# Patient Record
Sex: Male | Born: 1975 | Race: Black or African American | Hispanic: No | Marital: Married | Smoking: Never smoker
Health system: Southern US, Community
[De-identification: ages and names within clinical notes are randomized; demographics above are authoritative.]

## PROBLEM LIST (undated history)

## (undated) DIAGNOSIS — E119 Type 2 diabetes mellitus without complications: Secondary | ICD-10-CM

## (undated) HISTORY — PX: ACHILLES TENDON REPAIR: SUR1153

## (undated) HISTORY — PX: ELBOW SURGERY: SHX618

---

## 2005-07-01 ENCOUNTER — Emergency Department (HOSPITAL_COMMUNITY): Admission: EM | Admit: 2005-07-01 | Discharge: 2005-07-01 | Payer: Self-pay | Admitting: Emergency Medicine

## 2008-10-25 ENCOUNTER — Emergency Department (HOSPITAL_COMMUNITY): Admission: EM | Admit: 2008-10-25 | Discharge: 2008-10-25 | Payer: Self-pay | Admitting: Emergency Medicine

## 2008-11-17 ENCOUNTER — Ambulatory Visit (HOSPITAL_COMMUNITY): Admission: RE | Admit: 2008-11-17 | Discharge: 2008-11-17 | Payer: Self-pay | Admitting: Orthopaedic Surgery

## 2009-02-08 ENCOUNTER — Encounter: Admission: RE | Admit: 2009-02-08 | Discharge: 2009-03-01 | Payer: Self-pay | Admitting: Orthopaedic Surgery

## 2009-06-28 ENCOUNTER — Encounter: Admission: RE | Admit: 2009-06-28 | Discharge: 2009-06-28 | Payer: Self-pay | Admitting: Occupational Medicine

## 2016-08-14 ENCOUNTER — Encounter (HOSPITAL_COMMUNITY): Payer: Self-pay

## 2016-08-14 ENCOUNTER — Emergency Department (HOSPITAL_COMMUNITY): Payer: No Typology Code available for payment source

## 2016-08-14 ENCOUNTER — Emergency Department (HOSPITAL_COMMUNITY)
Admission: EM | Admit: 2016-08-14 | Discharge: 2016-08-14 | Disposition: A | Discharge status: Home / Self Care | Payer: No Typology Code available for payment source | Attending: Emergency Medicine | Admitting: Emergency Medicine

## 2016-08-14 DIAGNOSIS — Y939 Activity, unspecified: Secondary | ICD-10-CM | POA: Insufficient documentation

## 2016-08-14 DIAGNOSIS — Y92410 Unspecified street and highway as the place of occurrence of the external cause: Secondary | ICD-10-CM | POA: Diagnosis not present

## 2016-08-14 DIAGNOSIS — I10 Essential (primary) hypertension: Secondary | ICD-10-CM | POA: Insufficient documentation

## 2016-08-14 DIAGNOSIS — S46912A Strain of unspecified muscle, fascia and tendon at shoulder and upper arm level, left arm, initial encounter: Secondary | ICD-10-CM | POA: Diagnosis not present

## 2016-08-14 DIAGNOSIS — S161XXA Strain of muscle, fascia and tendon at neck level, initial encounter: Secondary | ICD-10-CM | POA: Diagnosis present

## 2016-08-14 DIAGNOSIS — E119 Type 2 diabetes mellitus without complications: Secondary | ICD-10-CM | POA: Insufficient documentation

## 2016-08-14 DIAGNOSIS — Y998 Other external cause status: Secondary | ICD-10-CM | POA: Diagnosis not present

## 2016-08-14 HISTORY — DX: Type 2 diabetes mellitus without complications: E11.9

## 2016-08-14 MED ORDER — IBUPROFEN 400 MG PO TABS
600.0000 mg | ORAL_TABLET | Freq: Once | ORAL | Status: AC
  Administered 2016-08-14: 600 mg via ORAL
  Filled 2016-08-14: qty 1

## 2016-08-14 MED ORDER — ACETAMINOPHEN 325 MG PO TABS
650.0000 mg | ORAL_TABLET | Freq: Once | ORAL | Status: AC
  Administered 2016-08-14: 650 mg via ORAL
  Filled 2016-08-14: qty 2
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
## 2016-08-14 NOTE — ED Notes (Signed)
Pt states he understands instructions. Home stable with wife with steady gait. 

## 2016-08-14 NOTE — ED Notes (Signed)
Pt rteturns from ct. Wife at bedside

## 2016-08-14 NOTE — ED Notes (Addendum)
Pt oob to br with steady gait 
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
